# Patient Record
Sex: Male | Born: 1965 | Race: Black or African American | Hispanic: No | Marital: Single | Smoking: Never smoker
Health system: Southern US, Community
[De-identification: ages and names within clinical notes are randomized; demographics above are authoritative.]

## PROBLEM LIST (undated history)

## (undated) DIAGNOSIS — I10 Essential (primary) hypertension: Secondary | ICD-10-CM

---

## 2017-05-07 ENCOUNTER — Encounter (HOSPITAL_COMMUNITY): Payer: Self-pay

## 2017-05-07 ENCOUNTER — Emergency Department (HOSPITAL_COMMUNITY)
Admission: EM | Admit: 2017-05-07 | Discharge: 2017-05-07 | Disposition: A | Discharge status: Home / Self Care | Payer: Self-pay | Attending: Emergency Medicine | Admitting: Emergency Medicine

## 2017-05-07 ENCOUNTER — Emergency Department (HOSPITAL_COMMUNITY): Payer: Self-pay

## 2017-05-07 DIAGNOSIS — R51 Headache: Secondary | ICD-10-CM | POA: Insufficient documentation

## 2017-05-07 DIAGNOSIS — R519 Headache, unspecified: Secondary | ICD-10-CM

## 2017-05-07 DIAGNOSIS — I10 Essential (primary) hypertension: Secondary | ICD-10-CM | POA: Insufficient documentation

## 2017-05-07 HISTORY — DX: Essential (primary) hypertension: I10

## 2017-05-07 LAB — CBC WITH DIFFERENTIAL/PLATELET
BASOS ABS: 0 10*3/uL (ref 0.0–0.1)
Basophils Relative: 0 %
EOS ABS: 0 10*3/uL (ref 0.0–0.7)
EOS PCT: 0 %
HCT: 41.4 % (ref 39.0–52.0)
Hemoglobin: 13.4 g/dL (ref 13.0–17.0)
Lymphocytes Relative: 8 %
Lymphs Abs: 1.1 10*3/uL (ref 0.7–4.0)
MCH: 25.4 pg — ABNORMAL LOW (ref 26.0–34.0)
MCHC: 32.4 g/dL (ref 30.0–36.0)
MCV: 78.4 fL (ref 78.0–100.0)
MONO ABS: 1.3 10*3/uL — AB (ref 0.1–1.0)
Monocytes Relative: 10 %
Neutro Abs: 11.2 10*3/uL — ABNORMAL HIGH (ref 1.7–7.7)
Neutrophils Relative %: 82 %
PLATELETS: 199 10*3/uL (ref 150–400)
RBC: 5.28 MIL/uL (ref 4.22–5.81)
RDW: 15.1 % (ref 11.5–15.5)
WBC: 13.6 10*3/uL — AB (ref 4.0–10.5)

## 2017-05-07 LAB — BASIC METABOLIC PANEL
Anion gap: 8 (ref 5–15)
BUN: 12 mg/dL (ref 6–20)
CALCIUM: 9.2 mg/dL (ref 8.9–10.3)
CO2: 23 mmol/L (ref 22–32)
CREATININE: 1.22 mg/dL (ref 0.61–1.24)
Chloride: 102 mmol/L (ref 101–111)
GFR calc Af Amer: >60 mL/min (ref >60)
GLUCOSE: 107 mg/dL — AB (ref 65–99)
Potassium: 3.5 mmol/L (ref 3.5–5.1)
SODIUM: 133 mmol/L — AB (ref 135–145)

## 2017-05-07 LAB — I-STAT TROPONIN, ED: TROPONIN I, POC: 0.02 ng/mL (ref 0.00–0.08)

## 2017-05-07 MED ORDER — ACETAMINOPHEN 325 MG PO TABS
650.0000 mg | ORAL_TABLET | Freq: Once | ORAL | Status: AC
  Administered 2017-05-07: 650 mg via ORAL
  Filled 2017-05-07: qty 2

## 2017-05-07 MED ORDER — DIPHENHYDRAMINE HCL 50 MG/ML IJ SOLN
25.0000 mg | Freq: Once | INTRAMUSCULAR | Status: AC
  Administered 2017-05-07: 25 mg via INTRAVENOUS
  Filled 2017-05-07: qty 1

## 2017-05-07 MED ORDER — METOCLOPRAMIDE HCL 5 MG/ML IJ SOLN
10.0000 mg | Freq: Once | INTRAMUSCULAR | Status: AC
  Administered 2017-05-07: 10 mg via INTRAVENOUS
  Filled 2017-05-07: qty 2

## 2017-05-07 MED ORDER — LISINOPRIL 10 MG PO TABS
10.0000 mg | ORAL_TABLET | Freq: Once | ORAL | Status: AC
  Administered 2017-05-07: 10 mg via ORAL
  Filled 2017-05-07: qty 1

## 2017-05-07 NOTE — ED Triage Notes (Signed)
[facility]- pt coming from work with complaint of headache. He has hx of hypertension, usually takes lisinopril on his break at 0930 but forgot his meds at home today. Pt alert and oriented. Rates headache 8/10. Initial BP 210/150.

## 2017-05-07 NOTE — ED Notes (Signed)
Pt returned to room from xray.

## 2017-05-07 NOTE — ED Provider Notes (Signed)
[facility]-EMERGENCY DEPT Provider Note   CSN: [id number] Arrival date & time:        History   Chief Complaint Chief Complaint  Patient presents with  . Headache  . Hypertension  . Abnormal ECG    HPI Timothy Lawson is a 51 y.o. male.  Timothy Lawson is a 51 y.o. Male with a history of hypertension who presents to the emergency department complaining of a gradual onset of a frontal headache this morning. He reports around 7:30 AM he noticed gradual onset of a frontal headache. He reports a history of headaches and migraines and reports this feels similar. He reports he also forgot to take his blood pressure medicine this morning. He typically takes lisinopril 10 mg. He took Aleve for his headache with little relief. EMS reported some EKG abnormalities based on computerized readout. No ST elevation noted on my evaluation of EKG. Patient denies any chest pain or shortness of breath. He denies fevers, changes to his vision, double vision, neck pain, chest pain, shortness of breath, palpitations, abdominal pain, nausea, vomiting, diarrhea, numbness, tingling, weakness or rashes.   The history is provided by the patient and medical records. No language interpreter was used.  Headache   Pertinent negatives include no fever, no palpitations, no shortness of breath, no nausea and no vomiting.  Hypertension  Associated symptoms include headaches. Pertinent negatives include no chest pain, no abdominal pain and no shortness of breath.    Past Medical History:  Diagnosis Date  . Hypertension     There are no active problems to display for this patient.   History reviewed. No pertinent surgical history.     Home Medications    Prior to Admission medications   Medication Sig Start Date End Date Taking? Authorizing Provider  ibuprofen (ADVIL,MOTRIN) 200 MG tablet Take 400 mg by mouth every 6 (six) hours as needed for mild pain.   Yes [provider]  naproxen sodium (ANAPROX) 220  MG tablet Take 440 mg by mouth 2 (two) times daily with a meal.   Yes [provider]    Family History History reviewed. No pertinent family history.  Social History Social History  Substance Use Topics  . Smoking status: Never Smoker  . Smokeless tobacco: Never Used  . Alcohol use No     Allergies   Patient has no known allergies.   Review of Systems Review of Systems  Constitutional: Negative for chills and fever.  HENT: Negative for congestion and sore throat.   Eyes: Negative for visual disturbance.  Respiratory: Negative for cough, shortness of breath and wheezing.   Cardiovascular: Negative for chest pain and palpitations.  Gastrointestinal: Negative for abdominal pain, diarrhea, nausea and vomiting.  Genitourinary: Negative for dysuria.  Musculoskeletal: Negative for back pain, neck pain and neck stiffness.  Skin: Negative for rash.  Neurological: Positive for headaches. Negative for dizziness, syncope, weakness, light-headedness and numbness.     Physical Exam Updated Vital Signs BP (!) 173/106   Pulse 71   Temp 98.8 F (37.1 C) (Oral)   Resp 17   SpO2 98%   Physical Exam  Constitutional: He is oriented to person, place, and time. He appears well-developed and well-nourished. No distress.  Nontoxic appearing.  HENT:  Head: Normocephalic and atraumatic.  Right Ear: External ear normal.  Left Ear: External ear normal.  Mouth/Throat: Oropharynx is clear and moist.  Eyes: Conjunctivae and EOM are normal. Pupils are equal, round, and reactive to light. Right eye exhibits no  discharge. Left eye exhibits no discharge.  Neck: Normal range of motion. Neck supple. No JVD present. No tracheal deviation present.  No meningeal signs.  Cardiovascular: Normal rate, regular rhythm, normal heart sounds and intact distal pulses.  Exam reveals no gallop and no friction rub.   No murmur heard. Pulmonary/Chest: Effort normal and breath sounds normal. No stridor.  No respiratory distress. He has no wheezes. He has no rales.  Lungs are clear to ascultation bilaterally. Symmetric chest expansion bilaterally. No increased work of breathing. No rales or rhonchi.    Abdominal: Soft. There is no tenderness. There is no guarding.  Musculoskeletal: He exhibits no edema or tenderness.  No LE edema or TTP.  Patient is spontaneously moving all extremities in a coordinated fashion exhibiting good strength.   Lymphadenopathy:    He has no cervical adenopathy.  Neurological: He is alert and oriented to person, place, and time. No cranial nerve deficit or sensory deficit. He exhibits normal muscle tone. Coordination normal.  Alert and oriented 3. Cranial nerves are intact. Speech is clear and coherent. No pronator drift. Finger to nose intact bilaterally. Sensation and strength is intact in his bilateral upper and lower extremities.  Skin: Skin is warm and dry. No rash noted. He is not diaphoretic. No erythema. No pallor.  Psychiatric: He has a normal mood and affect. His behavior is normal.  Nursing note and vitals reviewed.    ED Treatments / Results  Labs (all labs ordered are listed, but only abnormal results are displayed) Labs Reviewed  BASIC METABOLIC PANEL - Abnormal; Notable for the following:       Result Value   Sodium 133 (*)    Glucose, Bld 107 (*)    All other components within normal limits  CBC WITH DIFFERENTIAL/PLATELET - Abnormal; Notable for the following:    WBC 13.6 (*)    MCH 25.4 (*)    Neutro Abs 11.2 (*)    Monocytes Absolute 1.3 (*)    All other components within normal limits  I-STAT TROPOININ, ED    EKG  EKG Interpretation  Date/Time:  Monday May 07 2017 10:38:46 EDT Ventricular Rate:  80 PR Interval:    QRS Duration: 89 QT Interval:  383 QTC Calculation: 442 R Axis:   -53 Text Interpretation:  Sinus rhythm LAD, consider left anterior fascicular block Borderline T wave abnormalities No prior ECG for comparison.  No  STEMI Confirmed by Theda Belfast ([id number]) on 05/07/2017 10:44:39 AM       Radiology Dg Chest 2 View  Result Date: 05/07/2017 CLINICAL DATA:  Hypertension, headache, abnormal EKG EXAM: CHEST  2 VIEW COMPARISON:  None FINDINGS: Mild left basilar scarring/atelectasis. No focal consolidation No pleural effusion or pneumothorax. The heart is normal size. Mild degenerative changes of the visualized thoracolumbar spine. IMPRESSION: No evidence of acute cardiopulmonary disease. Electronically Signed   By: Charline Bills M.D.   On: 05/07/2017 11:23    Procedures Procedures (including critical care time)  Medications Ordered in ED Medications  lisinopril (PRINIVIL,ZESTRIL) tablet 10 mg (10 mg Oral Given 05/07/17 1153)  metoCLOPramide (REGLAN) injection 10 mg (10 mg Intravenous Given 05/07/17 1155)  diphenhydrAMINE (BENADRYL) injection 25 mg (25 mg Intravenous Given 05/07/17 1154)  acetaminophen (TYLENOL) tablet 650 mg (650 mg Oral Given 05/07/17 1153)     Initial Impression / Assessment and Plan / ED Course  I have reviewed the triage vital signs and the nursing notes.  Pertinent labs & imaging results that were available during  my care of the patient were reviewed by me and considered in my medical decision making (see chart for details).    This  is a 51 y.o. Male with a history of hypertension who presents to the emergency department complaining of a gradual onset of a frontal headache this morning. He reports around 7:30 AM he noticed gradual onset of a frontal headache. He reports a history of headaches and migraines and reports this feels similar. He reports he also forgot to take his blood pressure medicine this morning. He typically takes lisinopril 10 mg. He took Aleve for his headache with little relief. EMS reported some EKG abnormalities based on computerized readout. No ST elevation noted on my evaluation of EKG. Patient denies any chest pain or shortness of breath. On exam the patient is  afebrile and nontoxic appearing. He has no focal neurological deficits. Lungs are clear to auscultation bilaterally. EKG shows no evidence of STEMI. Troponin is not elevated. BMP is unremarkable. CBC is remarkable only for slight leukocytosis with a white count of 13,600. Chest x-ray is unremarkable. Low suspicion for subarachnoid hemorrhage, CVA or temporal arteritis in this patient. He is hypertensive in the emergency room. He was provided with lisinopril 10 mg, which she forgot to take this morning. He was also provided migraine cocktail. At reevaluation patient reports he is feeling much better. His headache is a must completely resolved. He still has no chest pain or SOB. We'll discharge at this time with close follow-up by primary care. Work up here is reassuring. Strict and specific return precautions discussed. I advised the patient to follow-up with their primary care provider this week. I advised the patient to return to the emergency department with new or worsening symptoms or new concerns. The patient verbalized understanding and agreement with plan.      Final Clinical Impressions(s) / ED Diagnoses   Final diagnoses:  Essential hypertension  Bad headache    New Prescriptions New Prescriptions   No medications on file     Everlene Farrier, Cordelia Poche 05/07/17 1342    Tegeler, Canary Brim, MD 05/07/17 [phone]

## 2018-04-25 IMAGING — CR DG CHEST 2V
2 series · 2 of 2 positions shown · non-contrast
Comparison: None

CLINICAL DATA: Hypertension, headache, abnormal EKG

EXAM:
CHEST  2 VIEW

[chest pa]
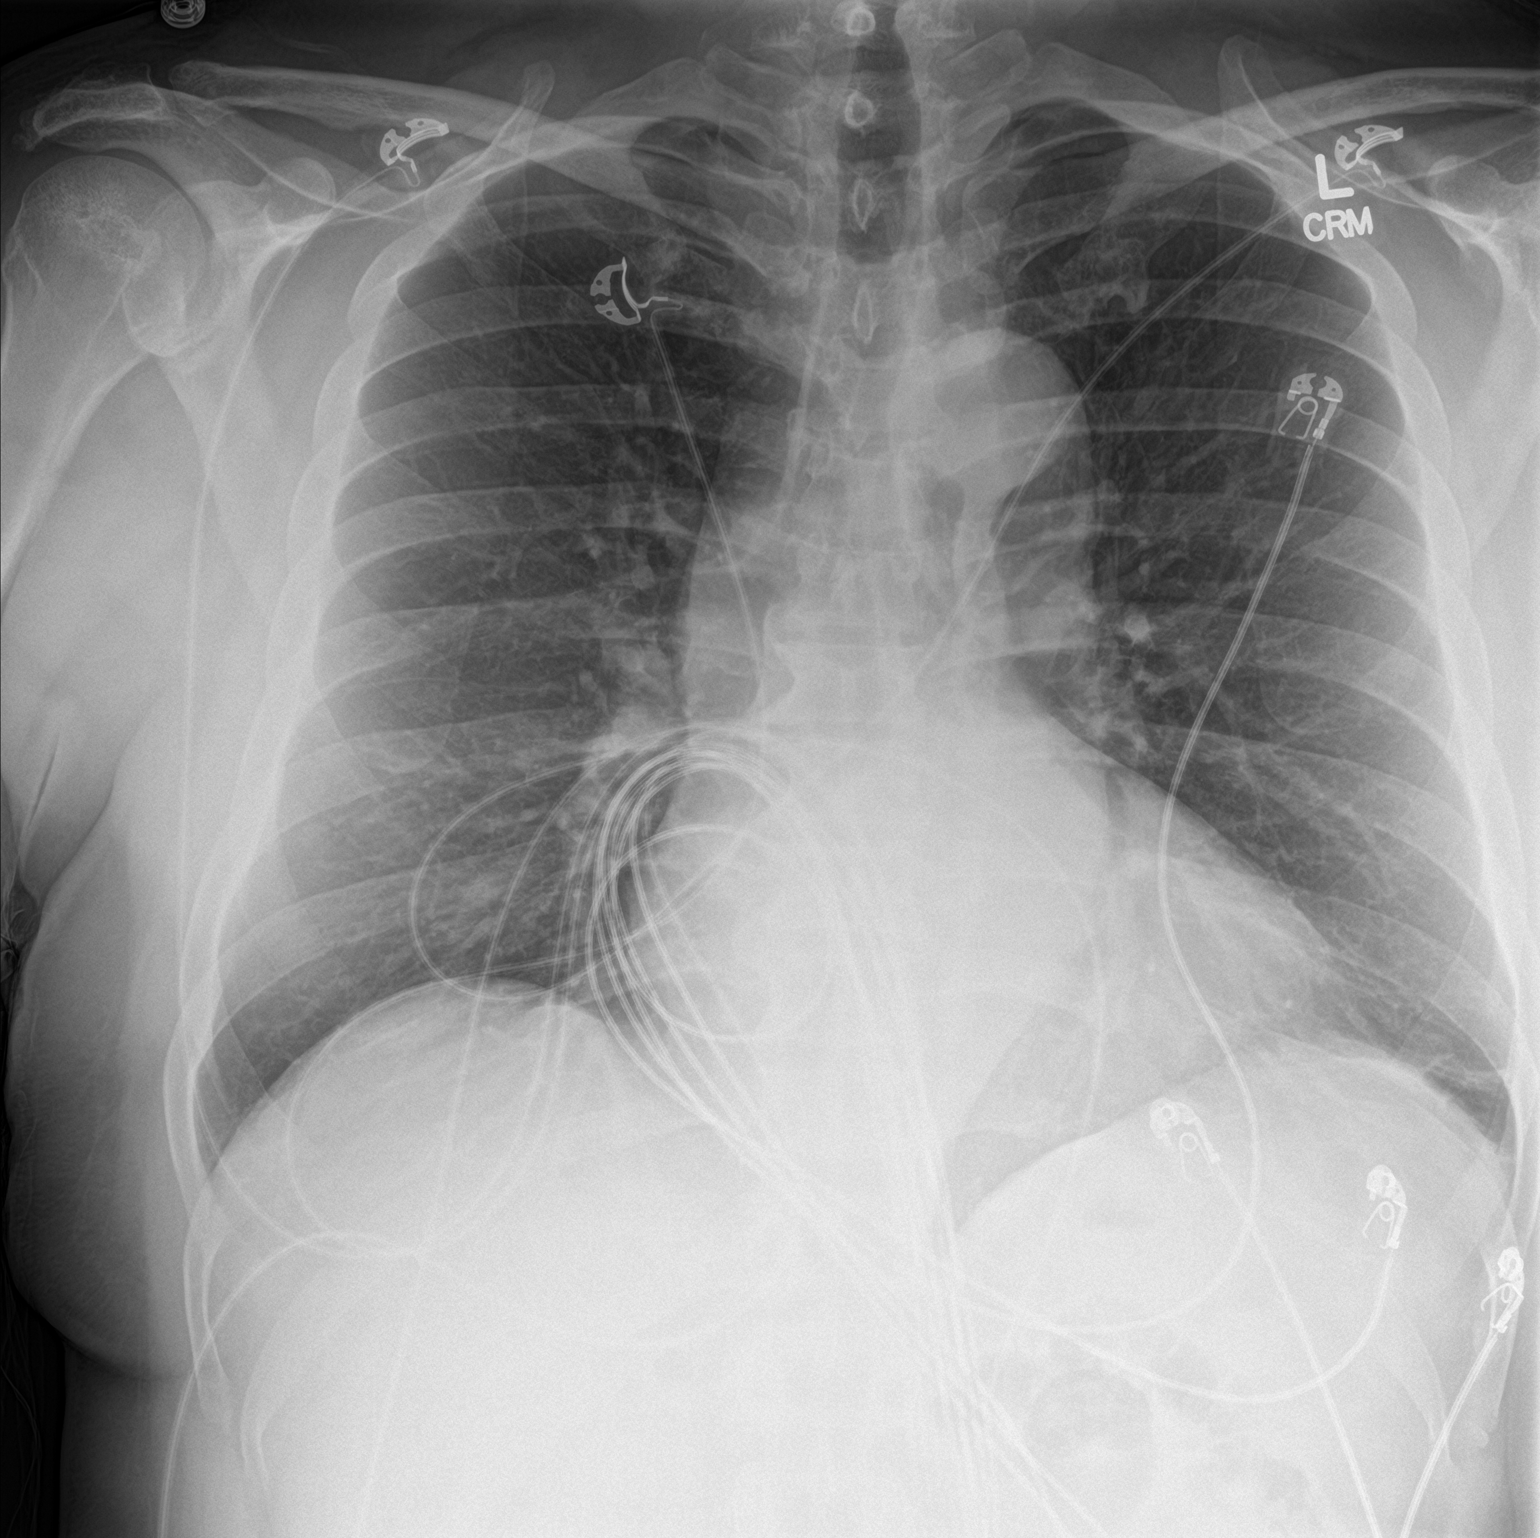

[chest lat]
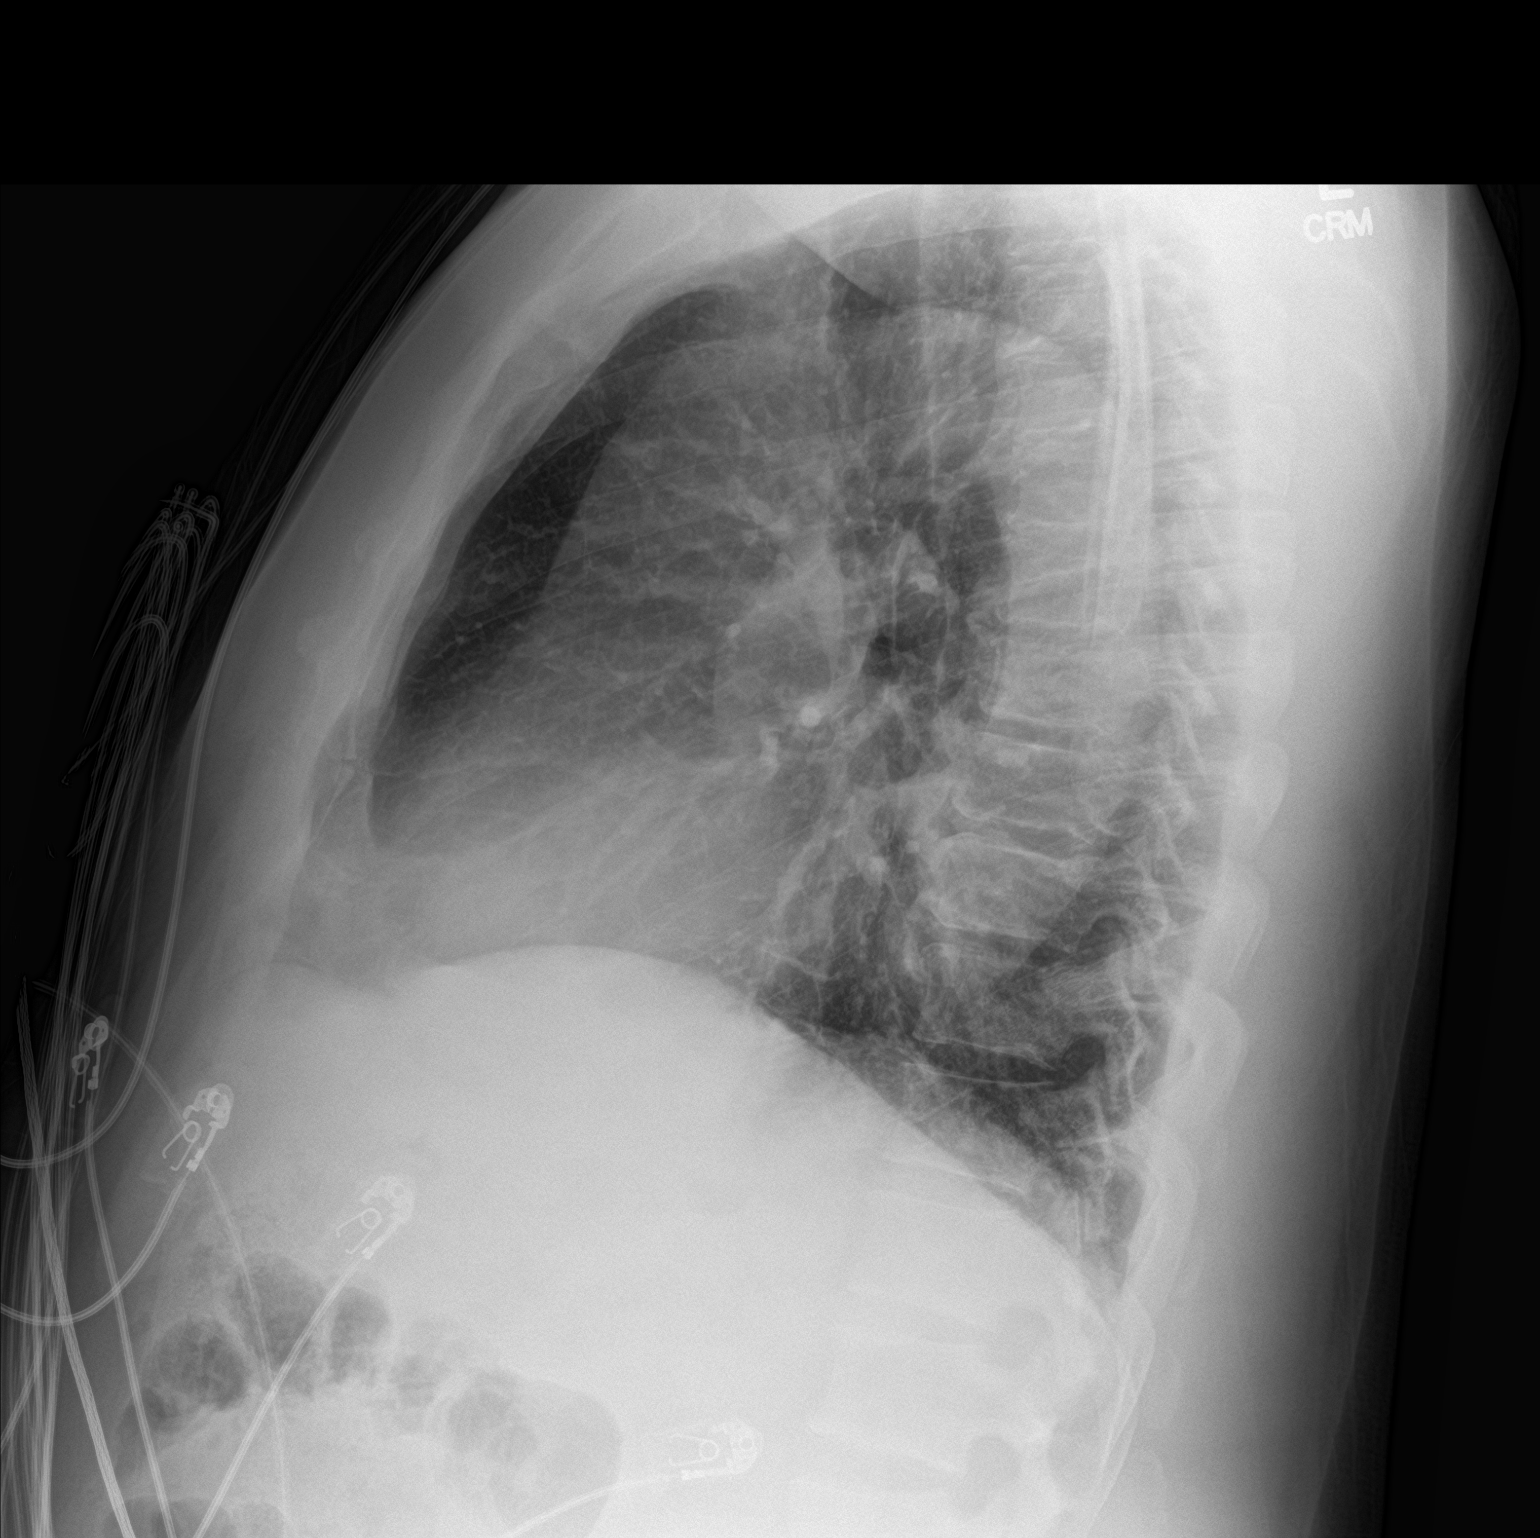

[2 of 2 positions shown; findings below may reference images not displayed]

FINDINGS: Mild left basilar scarring/atelectasis. No focal consolidation No
pleural effusion or pneumothorax.

The heart is normal size.

Mild degenerative changes of the visualized thoracolumbar spine.
IMPRESSION: No evidence of acute cardiopulmonary disease.
